# Patient Record
Sex: Female | Born: 1990 | Race: White | Hispanic: No | Marital: Single | Smoking: Never smoker
Health system: Southern US, Community
[De-identification: ages and names within clinical notes are randomized; demographics above are authoritative.]

## PROBLEM LIST (undated history)

## (undated) DIAGNOSIS — J352 Hypertrophy of adenoids: Secondary | ICD-10-CM

## (undated) DIAGNOSIS — F419 Anxiety disorder, unspecified: Secondary | ICD-10-CM

## (undated) DIAGNOSIS — M2652 Limited mandibular range of motion: Secondary | ICD-10-CM

## (undated) DIAGNOSIS — G43909 Migraine, unspecified, not intractable, without status migrainosus: Secondary | ICD-10-CM

## (undated) DIAGNOSIS — R05 Cough: Secondary | ICD-10-CM

## (undated) HISTORY — PX: WISDOM TOOTH EXTRACTION: SHX21

---
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
## 2013-12-30 DIAGNOSIS — J352 Hypertrophy of adenoids: Secondary | ICD-10-CM

## 2013-12-30 HISTORY — DX: Hypertrophy of adenoids: J35.2

## 2014-01-08 ENCOUNTER — Encounter (HOSPITAL_BASED_OUTPATIENT_CLINIC_OR_DEPARTMENT_OTHER): Payer: Self-pay | Admitting: *Deleted

## 2014-01-08 DIAGNOSIS — R059 Cough, unspecified: Secondary | ICD-10-CM

## 2014-01-08 HISTORY — DX: Cough, unspecified: R05.9

## 2014-01-13 ENCOUNTER — Ambulatory Visit (HOSPITAL_BASED_OUTPATIENT_CLINIC_OR_DEPARTMENT_OTHER): Admission: RE | Admit: 2014-01-13 | Payer: Self-pay | Source: Ambulatory Visit | Admitting: Otolaryngology

## 2014-01-13 HISTORY — DX: Anxiety disorder, unspecified: F41.9

## 2014-01-13 HISTORY — DX: Migraine, unspecified, not intractable, without status migrainosus: G43.909

## 2014-01-13 HISTORY — DX: Limited mandibular range of motion: M26.52

## 2014-01-13 HISTORY — DX: Cough: R05

## 2014-01-13 HISTORY — DX: Hypertrophy of adenoids: J35.2

## 2014-01-13 SURGERY — TONSILLECTOMY AND ADENOIDECTOMY
Anesthesia: General | Laterality: Bilateral

## 2015-07-15 DIAGNOSIS — Z6820 Body mass index (BMI) 20.0-20.9, adult: Secondary | ICD-10-CM | POA: Diagnosis not present

## 2015-07-15 DIAGNOSIS — Z113 Encounter for screening for infections with a predominantly sexual mode of transmission: Secondary | ICD-10-CM | POA: Diagnosis not present

## 2015-07-15 DIAGNOSIS — Z01419 Encounter for gynecological examination (general) (routine) without abnormal findings: Secondary | ICD-10-CM | POA: Diagnosis not present

## 2015-07-21 DIAGNOSIS — L739 Follicular disorder, unspecified: Secondary | ICD-10-CM | POA: Diagnosis not present

## 2015-09-07 DIAGNOSIS — Z23 Encounter for immunization: Secondary | ICD-10-CM | POA: Diagnosis not present

## 2015-09-07 DIAGNOSIS — Z111 Encounter for screening for respiratory tuberculosis: Secondary | ICD-10-CM | POA: Diagnosis not present

## 2015-09-09 DIAGNOSIS — Z23 Encounter for immunization: Secondary | ICD-10-CM | POA: Diagnosis not present

## 2015-09-21 DIAGNOSIS — M545 Low back pain: Secondary | ICD-10-CM | POA: Diagnosis not present

## 2015-09-21 DIAGNOSIS — M41124 Adolescent idiopathic scoliosis, thoracic region: Secondary | ICD-10-CM | POA: Diagnosis not present

## 2015-09-21 DIAGNOSIS — M546 Pain in thoracic spine: Secondary | ICD-10-CM | POA: Diagnosis not present

## 2015-09-21 DIAGNOSIS — F411 Generalized anxiety disorder: Secondary | ICD-10-CM | POA: Diagnosis not present

## 2015-09-22 DIAGNOSIS — M545 Low back pain: Secondary | ICD-10-CM | POA: Diagnosis not present

## 2015-11-05 DIAGNOSIS — M545 Low back pain: Secondary | ICD-10-CM | POA: Diagnosis not present

## 2015-11-05 DIAGNOSIS — G43109 Migraine with aura, not intractable, without status migrainosus: Secondary | ICD-10-CM | POA: Diagnosis not present

## 2015-11-05 DIAGNOSIS — F411 Generalized anxiety disorder: Secondary | ICD-10-CM | POA: Diagnosis not present

## 2016-01-20 DIAGNOSIS — F411 Generalized anxiety disorder: Secondary | ICD-10-CM | POA: Diagnosis not present

## 2016-04-04 DIAGNOSIS — F411 Generalized anxiety disorder: Secondary | ICD-10-CM | POA: Diagnosis not present

## 2016-07-11 DIAGNOSIS — N939 Abnormal uterine and vaginal bleeding, unspecified: Secondary | ICD-10-CM | POA: Diagnosis not present

## 2016-07-11 DIAGNOSIS — Z3202 Encounter for pregnancy test, result negative: Secondary | ICD-10-CM | POA: Diagnosis not present

## 2016-07-20 DIAGNOSIS — Z01419 Encounter for gynecological examination (general) (routine) without abnormal findings: Secondary | ICD-10-CM | POA: Diagnosis not present

## 2016-07-20 DIAGNOSIS — Z6821 Body mass index (BMI) 21.0-21.9, adult: Secondary | ICD-10-CM | POA: Diagnosis not present

## 2016-11-09 DIAGNOSIS — N921 Excessive and frequent menstruation with irregular cycle: Secondary | ICD-10-CM | POA: Diagnosis not present

## 2016-12-18 DIAGNOSIS — Z3009 Encounter for other general counseling and advice on contraception: Secondary | ICD-10-CM | POA: Diagnosis not present

## 2016-12-19 DIAGNOSIS — F411 Generalized anxiety disorder: Secondary | ICD-10-CM | POA: Diagnosis not present

## 2017-01-19 DIAGNOSIS — F419 Anxiety disorder, unspecified: Secondary | ICD-10-CM | POA: Diagnosis not present

## 2017-01-19 DIAGNOSIS — F324 Major depressive disorder, single episode, in partial remission: Secondary | ICD-10-CM | POA: Diagnosis not present

## 2017-04-05 DIAGNOSIS — L700 Acne vulgaris: Secondary | ICD-10-CM | POA: Diagnosis not present

## 2017-07-24 DIAGNOSIS — Z13220 Encounter for screening for lipoid disorders: Secondary | ICD-10-CM | POA: Diagnosis not present

## 2017-07-24 DIAGNOSIS — Z0000 Encounter for general adult medical examination without abnormal findings: Secondary | ICD-10-CM | POA: Diagnosis not present

## 2017-07-24 DIAGNOSIS — F411 Generalized anxiety disorder: Secondary | ICD-10-CM | POA: Diagnosis not present

## 2017-07-24 DIAGNOSIS — Z209 Contact with and (suspected) exposure to unspecified communicable disease: Secondary | ICD-10-CM | POA: Diagnosis not present

## 2017-07-24 DIAGNOSIS — Z111 Encounter for screening for respiratory tuberculosis: Secondary | ICD-10-CM | POA: Diagnosis not present

## 2017-08-10 DIAGNOSIS — Z111 Encounter for screening for respiratory tuberculosis: Secondary | ICD-10-CM | POA: Diagnosis not present

## 2017-12-24 DIAGNOSIS — Z01419 Encounter for gynecological examination (general) (routine) without abnormal findings: Secondary | ICD-10-CM | POA: Diagnosis not present

## 2017-12-24 DIAGNOSIS — Z6823 Body mass index (BMI) 23.0-23.9, adult: Secondary | ICD-10-CM | POA: Diagnosis not present

## 2017-12-24 DIAGNOSIS — Z1231 Encounter for screening mammogram for malignant neoplasm of breast: Secondary | ICD-10-CM | POA: Diagnosis not present

## 2017-12-26 DIAGNOSIS — F419 Anxiety disorder, unspecified: Secondary | ICD-10-CM | POA: Diagnosis not present

## 2018-01-31 DIAGNOSIS — F324 Major depressive disorder, single episode, in partial remission: Secondary | ICD-10-CM | POA: Diagnosis not present

## 2018-01-31 DIAGNOSIS — F419 Anxiety disorder, unspecified: Secondary | ICD-10-CM | POA: Diagnosis not present

## 2018-02-10 DIAGNOSIS — J069 Acute upper respiratory infection, unspecified: Secondary | ICD-10-CM | POA: Diagnosis not present

## 2018-02-15 DIAGNOSIS — J22 Unspecified acute lower respiratory infection: Secondary | ICD-10-CM | POA: Diagnosis not present

## 2018-04-09 DIAGNOSIS — L218 Other seborrheic dermatitis: Secondary | ICD-10-CM | POA: Diagnosis not present

## 2018-04-09 DIAGNOSIS — L821 Other seborrheic keratosis: Secondary | ICD-10-CM | POA: Diagnosis not present

## 2018-05-21 DIAGNOSIS — L821 Other seborrheic keratosis: Secondary | ICD-10-CM | POA: Diagnosis not present

## 2018-06-11 DIAGNOSIS — F324 Major depressive disorder, single episode, in partial remission: Secondary | ICD-10-CM | POA: Diagnosis not present

## 2018-06-11 DIAGNOSIS — F411 Generalized anxiety disorder: Secondary | ICD-10-CM | POA: Diagnosis not present

## 2018-07-09 DIAGNOSIS — F324 Major depressive disorder, single episode, in partial remission: Secondary | ICD-10-CM | POA: Diagnosis not present

## 2018-07-09 DIAGNOSIS — F411 Generalized anxiety disorder: Secondary | ICD-10-CM | POA: Diagnosis not present

## 2018-07-16 DIAGNOSIS — F4323 Adjustment disorder with mixed anxiety and depressed mood: Secondary | ICD-10-CM | POA: Diagnosis not present

## 2018-07-29 DIAGNOSIS — F4323 Adjustment disorder with mixed anxiety and depressed mood: Secondary | ICD-10-CM | POA: Diagnosis not present

## 2018-09-05 DIAGNOSIS — M546 Pain in thoracic spine: Secondary | ICD-10-CM | POA: Diagnosis not present

## 2018-09-05 DIAGNOSIS — M9903 Segmental and somatic dysfunction of lumbar region: Secondary | ICD-10-CM | POA: Diagnosis not present

## 2018-09-05 DIAGNOSIS — M9902 Segmental and somatic dysfunction of thoracic region: Secondary | ICD-10-CM | POA: Diagnosis not present

## 2018-09-05 DIAGNOSIS — M25551 Pain in right hip: Secondary | ICD-10-CM | POA: Diagnosis not present

## 2018-09-12 DIAGNOSIS — M9902 Segmental and somatic dysfunction of thoracic region: Secondary | ICD-10-CM | POA: Diagnosis not present

## 2018-09-12 DIAGNOSIS — M9903 Segmental and somatic dysfunction of lumbar region: Secondary | ICD-10-CM | POA: Diagnosis not present

## 2018-09-12 DIAGNOSIS — M25551 Pain in right hip: Secondary | ICD-10-CM | POA: Diagnosis not present

## 2018-09-12 DIAGNOSIS — M546 Pain in thoracic spine: Secondary | ICD-10-CM | POA: Diagnosis not present

## 2018-09-17 DIAGNOSIS — M546 Pain in thoracic spine: Secondary | ICD-10-CM | POA: Diagnosis not present

## 2018-09-17 DIAGNOSIS — M25551 Pain in right hip: Secondary | ICD-10-CM | POA: Diagnosis not present

## 2018-09-17 DIAGNOSIS — M9902 Segmental and somatic dysfunction of thoracic region: Secondary | ICD-10-CM | POA: Diagnosis not present

## 2018-09-17 DIAGNOSIS — M9903 Segmental and somatic dysfunction of lumbar region: Secondary | ICD-10-CM | POA: Diagnosis not present

## 2018-09-25 DIAGNOSIS — M9903 Segmental and somatic dysfunction of lumbar region: Secondary | ICD-10-CM | POA: Diagnosis not present

## 2018-09-25 DIAGNOSIS — M546 Pain in thoracic spine: Secondary | ICD-10-CM | POA: Diagnosis not present

## 2018-09-25 DIAGNOSIS — M9902 Segmental and somatic dysfunction of thoracic region: Secondary | ICD-10-CM | POA: Diagnosis not present

## 2018-09-25 DIAGNOSIS — M25551 Pain in right hip: Secondary | ICD-10-CM | POA: Diagnosis not present

## 2018-09-30 DIAGNOSIS — M546 Pain in thoracic spine: Secondary | ICD-10-CM | POA: Diagnosis not present

## 2018-09-30 DIAGNOSIS — M9903 Segmental and somatic dysfunction of lumbar region: Secondary | ICD-10-CM | POA: Diagnosis not present

## 2018-09-30 DIAGNOSIS — M9902 Segmental and somatic dysfunction of thoracic region: Secondary | ICD-10-CM | POA: Diagnosis not present

## 2018-09-30 DIAGNOSIS — M25551 Pain in right hip: Secondary | ICD-10-CM | POA: Diagnosis not present

## 2018-10-08 DIAGNOSIS — M25551 Pain in right hip: Secondary | ICD-10-CM | POA: Diagnosis not present

## 2018-10-08 DIAGNOSIS — M9902 Segmental and somatic dysfunction of thoracic region: Secondary | ICD-10-CM | POA: Diagnosis not present

## 2018-10-08 DIAGNOSIS — M9903 Segmental and somatic dysfunction of lumbar region: Secondary | ICD-10-CM | POA: Diagnosis not present

## 2018-10-08 DIAGNOSIS — M546 Pain in thoracic spine: Secondary | ICD-10-CM | POA: Diagnosis not present

## 2018-10-15 DIAGNOSIS — M9902 Segmental and somatic dysfunction of thoracic region: Secondary | ICD-10-CM | POA: Diagnosis not present

## 2018-10-15 DIAGNOSIS — M546 Pain in thoracic spine: Secondary | ICD-10-CM | POA: Diagnosis not present

## 2018-10-15 DIAGNOSIS — M25551 Pain in right hip: Secondary | ICD-10-CM | POA: Diagnosis not present

## 2018-10-15 DIAGNOSIS — M9903 Segmental and somatic dysfunction of lumbar region: Secondary | ICD-10-CM | POA: Diagnosis not present

## 2018-10-23 DIAGNOSIS — M546 Pain in thoracic spine: Secondary | ICD-10-CM | POA: Diagnosis not present

## 2018-10-23 DIAGNOSIS — M9902 Segmental and somatic dysfunction of thoracic region: Secondary | ICD-10-CM | POA: Diagnosis not present

## 2018-10-23 DIAGNOSIS — M25551 Pain in right hip: Secondary | ICD-10-CM | POA: Diagnosis not present

## 2018-10-23 DIAGNOSIS — M9903 Segmental and somatic dysfunction of lumbar region: Secondary | ICD-10-CM | POA: Diagnosis not present

## 2018-11-13 DIAGNOSIS — M546 Pain in thoracic spine: Secondary | ICD-10-CM | POA: Diagnosis not present

## 2018-11-13 DIAGNOSIS — M25551 Pain in right hip: Secondary | ICD-10-CM | POA: Diagnosis not present

## 2018-11-13 DIAGNOSIS — M9903 Segmental and somatic dysfunction of lumbar region: Secondary | ICD-10-CM | POA: Diagnosis not present

## 2018-11-13 DIAGNOSIS — M9902 Segmental and somatic dysfunction of thoracic region: Secondary | ICD-10-CM | POA: Diagnosis not present

## 2019-01-03 DIAGNOSIS — Z01419 Encounter for gynecological examination (general) (routine) without abnormal findings: Secondary | ICD-10-CM | POA: Diagnosis not present

## 2019-01-03 DIAGNOSIS — F419 Anxiety disorder, unspecified: Secondary | ICD-10-CM | POA: Diagnosis not present

## 2019-01-03 DIAGNOSIS — F324 Major depressive disorder, single episode, in partial remission: Secondary | ICD-10-CM | POA: Diagnosis not present

## 2019-01-03 DIAGNOSIS — Z113 Encounter for screening for infections with a predominantly sexual mode of transmission: Secondary | ICD-10-CM | POA: Diagnosis not present

## 2019-01-03 DIAGNOSIS — N760 Acute vaginitis: Secondary | ICD-10-CM | POA: Diagnosis not present

## 2019-01-03 DIAGNOSIS — Z6823 Body mass index (BMI) 23.0-23.9, adult: Secondary | ICD-10-CM | POA: Diagnosis not present

## 2020-06-25 ENCOUNTER — Encounter: Payer: Self-pay | Admitting: Neurology

## 2020-10-07 NOTE — Progress Notes (Signed)
NEUROLOGY CONSULTATION NOTE  Carolyn Hodges MRN: [id number] DOB: 02-Feb-1990  Referring provider: Laurann Montana, MD Primary care provider: Laurann Montana, MD  Reason for consult:  headaches  Assessment/Plan:   Migraine with aura, without status migrainosus, not intractable  Migraine prevention:  continue zonisamide 100mg  daily Migraine rescue:  She will try Maxalt-MLT 10mg  with Zofran-ODT 4mg  Limit use of pain relievers to no more than 2 days out of week to prevent risk of rebound or medication-overuse headache. Keep headache diary Follow up 6 months    Subjective:  Carolyn Hodges is a 29 year old female who presents for headaches.  History supplemented by referring provider's note.  Onset:  30 years old Location:  diffuse Quality:  throbbing Intensity:  3-7/10 Aura:  preceded by bilateral hand numbness, tunnel vision, followed by perioral numbness Prodrome:  absent Associated symptoms:  Nausea, vomiting, photophobia, phonophobia.  She denies associated vomiting, autonomic symptoms or unilateral weakness. Duration:  45 minutes to several hours to days (usually 5-6 hours) Frequency:  1-3 times a month.  Since starting zonisamide in November 2021, she has had about one every other month Frequency of abortive medication: 1 every other month at most Triggers:  Sometimes may be emotional stress Relieving factors:  stay in bed, cold compress on head Activity:  movement/activity aggravates headache  Current NSAIDS/analgesics:  none Current triptans:  none Current ergotamine:  none Current anti-emetic:  none Current muscle relaxants:  none Current Antihypertensive medications:  none Current Antidepressant medications:  Trintelix Current Anticonvulsant medications:  zonisamide 100mg  daily Current anti-CGRP:  none Current Vitamins/Herbal/Supplements:  none Current Antihistamines/Decongestants:  none Other therapy:  none Hormone/birth control:  none   Past  NSAIDS/analgesics:  ibuprofen, acetaminophen Past abortive triptans:  Zomig 5mg  NS  Past abortive ergotamine:  none Past muscle relaxants:  baclofen (ineffective) Past anti-emetic:  none Past antihypertensive medications:  none Past antidepressant medications:  sertraline, Lexapro Past anticonvulsant medications:  none Past anti-CGRP:  none Past vitamins/Herbal/Supplements:  none Past antihistamines/decongestants:  none Other past therapies:  none  Caffeine:  3 oz coffee 4 days a week. Diet:  Usually drinks a lot of water.  Does not skip meals Exercise:  yes Depression:  yes; Anxiety:  yes.  Stable Other pain:  back pain Sleep hygiene:  varies. Family history of headache:  mom    Remote CT head from 09/27/2005 personally reviewed was negative.  PAST MEDICAL HISTORY: Past Medical History:  Diagnosis Date   Adenoid hypertrophy 12/2013   Anxiety    Cough 01/08/2014   Limited jaw range of motion    states jaw locks occasionally   Migraines     PAST SURGICAL HISTORY: Past Surgical History:  Procedure Laterality Date   WISDOM TOOTH EXTRACTION      MEDICATIONS: Current Outpatient Medications on File Prior to Visit  Medication Sig Dispense Refill   escitalopram (LEXAPRO) 10 MG tablet Take 10 mg by mouth daily.     Multiple Vitamin (MULTIVITAMIN) tablet Take 1 tablet by mouth daily.     norgestimate-ethinyl estradiol (MONO-LINYAH) 0.25-35 MG-MCG tablet Take 1 tablet by mouth daily.     No current facility-administered medications on file prior to visit.    ALLERGIES: Allergies  Allergen Reactions   Sulfa Antibiotics Other (See Comments)    UNKNOWN - AS A CHILD    FAMILY HISTORY: No family history on file.  Objective:  Blood pressure 116/84, pulse 84, height 5\' 6"  (1.676 m), weight 143 lb (64.9 kg), SpO2 98 %.  General: No acute distress.  Patient appears well-groomed.   Head:  Normocephalic/atraumatic Eyes:  fundi examined but not visualized Neck: supple, no  paraspinal tenderness, full range of motion Back: No paraspinal tenderness Heart: regular rate and rhythm Lungs: Clear to auscultation bilaterally. Vascular: No carotid bruits. Neurological Exam: Mental status: alert and oriented to person, place, and time, recent and remote memory intact, fund of knowledge intact, attention and concentration intact, speech fluent and not dysarthric, language intact. Cranial nerves: CN I: not tested CN II: pupils equal, round and reactive to light, visual fields intact CN III, IV, VI:  full range of motion, no nystagmus, no ptosis CN V: facial sensation intact. CN VII: upper and lower face symmetric CN VIII: hearing intact CN IX, X: gag intact, uvula midline CN XI: sternocleidomastoid and trapezius muscles intact CN XII: tongue midline Bulk & Tone: normal, no fasciculations. Motor:  muscle strength 5/5 throughout Sensation:  Pinprick, temperature and vibratory sensation intact. Deep Tendon Reflexes:  2+ throughout,  toes downgoing.   Finger to nose testing:  Without dysmetria.   Heel to shin:  Without dysmetria.   Gait:  Normal station and stride.  Romberg negative.    Thank you for allowing me to take part in the care of this patient.  Shon Millet, DO  CC: Laurann Montana, MD

## 2020-10-08 ENCOUNTER — Encounter: Payer: Self-pay | Admitting: Neurology

## 2020-10-08 ENCOUNTER — Ambulatory Visit: Payer: Managed Care, Other (non HMO) | Admitting: Neurology

## 2020-10-08 ENCOUNTER — Other Ambulatory Visit: Payer: Self-pay

## 2020-10-08 VITALS — BP 116/84 | HR 84 | Ht 66.0 in | Wt 143.0 lb

## 2020-10-08 DIAGNOSIS — G43109 Migraine with aura, not intractable, without status migrainosus: Secondary | ICD-10-CM

## 2020-10-08 MED ORDER — ZONISAMIDE 100 MG PO CAPS: 100.0000 mg | ORAL_CAPSULE | Freq: Every day | ORAL | 5 refills | Status: DC

## 2020-10-08 MED ORDER — ONDANSETRON 4 MG PO TBDP: 4.0000 mg | ORAL_TABLET | Freq: Three times a day (TID) | ORAL | 5 refills | Status: DC | PRN

## 2020-10-08 MED ORDER — RIZATRIPTAN BENZOATE 10 MG PO TBDP: ORAL_TABLET | ORAL | 5 refills | Status: DC

## 2020-10-08 NOTE — Patient Instructions (Signed)
  Continue zonisamide 100mg  daily Take rizatriptan 30m at earliest onset of headache.  May repeat dose once in 2 hours if needed.  Maximum 2 tablets in 24 hours.  Also take ondansetron for nausea. Limit use of pain relievers to no more than 2 days out of the week.  These medications include acetaminophen, NSAIDs (ibuprofen/Advil/Motrin, naproxen/Aleve, triptans (Imitrex/sumatriptan), Excedrin, and narcotics.  This will help reduce risk of rebound headaches. Be aware of common food triggers:  - Caffeine:  coffee, black tea, cola, Mt. Dew  - Chocolate  - Dairy:  aged cheeses (brie, blue, cheddar, gouda, [place], provolone, [place], Swiss, etc), chocolate milk, buttermilk, sour cream, limit eggs and yogurt  - Nuts, peanut butter  - Alcohol  - Cereals/grains:  FRESH breads (fresh bagels, sourdough, doughnuts), yeast productions  - Processed/canned/aged/cured meats (pre-packaged deli meats, hotdogs)  - MSG/glutamate:  soy sauce, flavor enhancer, pickled/preserved/marinated foods  - Sweeteners:  aspartame (Equal, Nutrasweet).  Sugar and Splenda are okay  - Vegetables:  legumes (lima beans, lentils, snow peas, fava beans, pinto peans, peas, garbanzo beans), sauerkraut, onions, olives, pickles  - Fruit:  avocados, bananas, citrus fruit (orange, lemon, grapefruit), mango  - Other:  Frozen meals, macaroni and cheese Routine exercise Stay adequately hydrated (aim for 64 oz water daily) Keep headache diary Maintain proper stress management Maintain proper sleep hygiene Do not skip meals Consider supplements:  magnesium citrate 400mg  daily, riboflavin 400mg  daily, coenzyme Q10 100mg  three times daily.

## 2021-01-03 ENCOUNTER — Encounter: Payer: Self-pay | Admitting: Neurology

## 2021-04-03 ENCOUNTER — Other Ambulatory Visit: Payer: Self-pay | Admitting: Neurology

## 2021-04-06 NOTE — Progress Notes (Signed)
? ?Virtual Visit via Video Note ?The purpose of this virtual visit is to provide medical care while limiting exposure to the novel coronavirus.   ? ?Consent was obtained for video visit:  Yes.   ?Answered questions that patient had about telehealth interaction:  Yes.   ?I discussed the limitations, risks, security and privacy concerns of performing an evaluation and management service by telemedicine. I also discussed with the patient that there may be a patient responsible charge related to this service. The patient expressed understanding and agreed to proceed. ? ?Pt location: Home ?Physician Location: office ?Name of referring provider:  Harlan Stains, MD ?I connected with Carolyn Hodges at patients initiation/request on 04/08/2021 at  9:10 AM EST by video enabled telemedicine application and verified that I am speaking with the correct person using two identifiers. ?Pt MRN:  [id number] ?Pt DOB:  05/19/1990 ?Video Participants:  Carolyn Hodges ? ?Assessment and Plan:   ?Migraine with aura, without status migrainosus, not intractable ? ?Migraine prevention:  Zonisamide 100mg  daily.  Reports nausea, possibly from the medication but does not want to stop it as it has been effective.  Continue to monitor.  She may consider taking a Zofran. ?Migraine rescue:  rizatriptan MLT 10mg , Zofran for nausea. ?Limit use of pain relievers to no more than 2 days out of week to prevent risk of rebound or medication-overuse headache. ?Keep headache diary ?Follow up 6 months. ? ? ?History of Present Illness:  ?Carolyn Hodges is a 31 year old female who follows up for migraine ? ?UPDATE: ?Intensity:  severe ?Duration:  2 hours ?Frequency:  only one in last 6 months. ? ?She notes that when she first started zonisamide, she would wake up with nausea every morning.  This lasted 2 months and it resolved.  The morning nausea returned about a week and a half ago.  She is not sure if it is related to zonisamide.  ? ?Current  NSAIDS/analgesics:  none ?Current triptans:  rizatriptan MLT 10mg  ?Current ergotamine:  none ?Current anti-emetic:  Zofran 4mg  ?Current muscle relaxants:  none ?Current Antihypertensive medications:  none ?Current Antidepressant medications:  Trintelix ?Current Anticonvulsant medications:  zonisamide 100mg  daily ?Current anti-CGRP:  none ?Current Vitamins/Herbal/Supplements:  none ?Current Antihistamines/Decongestants:  none ?Other therapy:  none ?Hormone/birth control:  none ? ?Caffeine:  3 oz coffee 4 days a week. ?Diet:  Usually drinks a lot of water.  Does not skip meals ?Exercise:  yes ?Depression:  yes; Anxiety:  yes.  Stable ?Other pain:  back pain ?Sleep hygiene:  varies. ? ?HISTORY: ?Onset:  31 years old ?Location:  diffuse ?Quality:  throbbing ?Initial Intensity:  3-7/10 ?Aura:  preceded by bilateral hand numbness, tunnel vision, followed by perioral numbness ?Prodrome:  absent ?Associated symptoms:  Nausea, vomiting, photophobia, phonophobia.  She denies associated vomiting, autonomic symptoms or unilateral weakness. ?Initial Duration:  45 minutes to several hours to days (usually 5-6 hours) ?Initial Frequency:  1-3 times a month.  Since starting zonisamide in November 2021, she has had about one every other month ?Frequency of abortive medication: 1 every other month at most ?Triggers:  Sometimes may be emotional stress, high sugar intake ?Relieving factors:  stay in bed, cold compress on head ?Activity:  movement/activity aggravates headache ? ?  ?Past NSAIDS/analgesics:  ibuprofen, acetaminophen ?Past abortive triptans:  Zomig 5mg  NS  ?Past abortive ergotamine:  none ?Past muscle relaxants:  baclofen (ineffective) ?Past anti-emetic:  none ?Past antihypertensive medications:  none ?Past antidepressant medications:  sertraline, Lexapro ?  Past anticonvulsant medications:  none ?Past anti-CGRP:  none ?Past vitamins/Herbal/Supplements:  none ?Past antihistamines/decongestants:  none ?Other past therapies:   none ?  ? ?Family history of headache:  mom  ?  ? Remote CT head from 09/27/2005 personally reviewed was negative. ? ?Past Medical History: ?Past Medical History:  ?Diagnosis Date  ? Adenoid hypertrophy 12/2013  ? Anxiety   ? Cough 01/08/2014  ? Limited jaw range of motion   ? states jaw locks occasionally  ? Migraines   ? ? ?Medications: ?Outpatient Encounter Medications as of 04/08/2021  ?Medication Sig  ? escitalopram (LEXAPRO) 10 MG tablet Take 10 mg by mouth daily. (Patient not taking: Reported on 10/08/2020)  ? Multiple Vitamin (MULTIVITAMIN) tablet Take 1 tablet by mouth daily.  ? norgestimate-ethinyl estradiol (ORTHO-CYCLEN) 0.25-35 MG-MCG tablet Take 1 tablet by mouth daily. (Patient not taking: Reported on 10/08/2020)  ? ondansetron (ZOFRAN ODT) 4 MG disintegrating tablet Take 1 tablet (4 mg total) by mouth every 8 (eight) hours as needed for nausea or vomiting.  ? rizatriptan (MAXALT-MLT) 10 MG disintegrating tablet Take 1 tablet earliest onset of headache.  May repeat in 2 hours if needed.  Maximum 2 tablets in 24 hours.  ? TRINTELLIX 5 MG TABS tablet Take 5 mg by mouth daily.  ? zonisamide (ZONEGRAN) 100 MG capsule Take 1 capsule (100 mg total) by mouth daily.  ? ?No facility-administered encounter medications on file as of 04/08/2021.  ? ? ?Allergies: ?Allergies  ?Allergen Reactions  ? Sulfa Antibiotics Other (See Comments)  ?  UNKNOWN - AS A CHILD  ? ? ?Family History: ?Family History  ?Problem Relation Age of Onset  ? Migraines Mother   ? ? ?Observations/Objective:   ?No acute distress.  Alert and oriented.  Speech fluent and not dysarthric.  Language intact.  Eyes orthophoric on primary gaze.  Face symmetric. ? ? ?Follow Up Instructions: ?  ? -I discussed the assessment and treatment plan with the patient. The patient was provided an opportunity to ask questions and all were answered. The patient agreed with the plan and demonstrated an understanding of the instructions. ?  ?The patient was advised to call  back or seek an in-person evaluation if the symptoms worsen or if the condition fails to improve as anticipated. ? ? ?Dudley Major, DO ? ?

## 2021-04-08 ENCOUNTER — Telehealth (INDEPENDENT_AMBULATORY_CARE_PROVIDER_SITE_OTHER): Payer: Managed Care, Other (non HMO) | Admitting: Neurology

## 2021-04-08 ENCOUNTER — Encounter: Payer: Self-pay | Admitting: Neurology

## 2021-04-08 VITALS — Ht 66.0 in | Wt 143.0 lb

## 2021-04-08 DIAGNOSIS — G43109 Migraine with aura, not intractable, without status migrainosus: Secondary | ICD-10-CM

## 2021-04-08 MED ORDER — ZONISAMIDE 100 MG PO CAPS: 100.0000 mg | ORAL_CAPSULE | Freq: Every day | ORAL | 5 refills | Status: DC

## 2021-04-22 ENCOUNTER — Ambulatory Visit: Payer: Managed Care, Other (non HMO) | Admitting: Neurology

## 2021-09-28 NOTE — Progress Notes (Unsigned)
NEUROLOGY FOLLOW UP OFFICE NOTE  TIZIANA CISLO [id number]  Assessment/Plan:   Migraine with aura, without status migrainosus, not intractable   Migraine prevention:  Changing jobs will likely help but she would still like to increase zonisamide now.  Increase zonisamide to 200mg  daily.   Migraine rescue:  rizatriptan MLT 10mg , Zofran for nausea. Limit use of pain relievers to no more than 2 days out of week to prevent risk of rebound or medication-overuse headache. Keep headache diary Follow up 6 months.  Subjective:  JANITZA REVUELTA is a 31 year old female who follows up for migraine   UPDATE: Started having increased migraines soon after last visit.  She reports it was job-related stress.  However, she is starting a new job next month and hopes that will help Intensity:  severe Duration:  20-60 minutes with rizatriptan Frequency:  4-5 a month.    Current NSAIDS/analgesics:  none Current triptans:  rizatriptan MLT 10mg  Current ergotamine:  none Current anti-emetic:  Zofran 4mg  Current muscle relaxants:  none Current Antihypertensive medications:  none Current Antidepressant medications:  Trintelix Current Anticonvulsant medications:  zonisamide 100mg  daily Current anti-CGRP:  none Current Vitamins/Herbal/Supplements:  none Current Antihistamines/Decongestants:  none Other therapy:  none Hormone/birth control:  none   Caffeine:  3 oz coffee 4 days a week. Diet:  Usually drinks a lot of water.  Does not skip meals Exercise:  yes Depression:  yes; Anxiety:  yes.  Stable Other pain:  back pain Sleep hygiene:  varies.   HISTORY: Onset:  31 years old Location:  diffuse Quality:  throbbing Initial Intensity:  3-7/10 Aura:  preceded by bilateral hand numbness, tunnel vision, followed by perioral numbness Prodrome:  absent Associated symptoms:  Nausea, vomiting, photophobia, phonophobia.  She denies associated vomiting, autonomic symptoms or unilateral  weakness. Initial Duration:  45 minutes to several hours to days (usually 5-6 hours) Initial Frequency:  1-3 times a month.  Since starting zonisamide in November 2021, she has had about one every other month Frequency of abortive medication: 1 every other month at most Triggers:  Sometimes may be emotional stress, high sugar intake Relieving factors:  stay in bed, cold compress on head Activity:  movement/activity aggravates headache     Past NSAIDS/analgesics:  ibuprofen, acetaminophen Past abortive triptans:  Zomig 5mg  NS  Past abortive ergotamine:  none Past muscle relaxants:  baclofen (ineffective) Past anti-emetic:  none Past antihypertensive medications:  none Past antidepressant medications:  sertraline, Lexapro Past anticonvulsant medications:  none Past anti-CGRP:  none Past vitamins/Herbal/Supplements:  none Past antihistamines/decongestants:  none Other past therapies:  none     Family history of headache:  mom     Remote CT head from 09/27/2005 personally reviewed was negative.  PAST MEDICAL HISTORY: Past Medical History:  Diagnosis Date   Adenoid hypertrophy 12/2013   Anxiety    Cough 01/08/2014   Limited jaw range of motion    states jaw locks occasionally   Migraines     MEDICATIONS: Current Outpatient Medications on File Prior to Visit  Medication Sig Dispense Refill   etonogestrel-ethinyl estradiol (NUVARING) 0.12-0.015 MG/24HR vaginal ring 1 ring leave in place for 3 weeks, remove, and replace with a new ring after 7 day break     ondansetron (ZOFRAN ODT) 4 MG disintegrating tablet Take 1 tablet (4 mg total) by mouth every 8 (eight) hours as needed for nausea or vomiting. 20 tablet 5   rizatriptan (MAXALT-MLT) 10 MG disintegrating tablet Take 1 tablet earliest  onset of headache.  May repeat in 2 hours if needed.  Maximum 2 tablets in 24 hours. 10 tablet 5   TRINTELLIX 5 MG TABS tablet Take 5 mg by mouth daily.     zonisamide (ZONEGRAN) 100 MG capsule  Take 1 capsule (100 mg total) by mouth daily. 30 capsule 5   No current facility-administered medications on file prior to visit.    ALLERGIES: Allergies  Allergen Reactions   Sulfa Antibiotics Other (See Comments)    UNKNOWN - AS A CHILD    FAMILY HISTORY: Family History  Problem Relation Age of Onset   Migraines Mother       Objective:  Blood pressure 116/84, pulse 74, resp. rate 18, height 5\' 6"  (1.676 m), weight 143 lb (64.9 kg). General: No acute distress.  Patient appears well-groomed.   Head:  Normocephalic/atraumatic Eyes:  Fundi examined but not visualized Neck: supple, no paraspinal tenderness, full range of motion Heart:  Regular rate and rhythm Neurological Exam: alert and oriented to person, place, and time.  Speech fluent and not dysarthric, language intact.  CN II-XII intact. Bulk and tone normal, muscle strength 5/5 throughout.  Sensation to light touch intact.  Deep tendon reflexes 2+ throughout, toes downgoing.  Finger to nose testing intact.  Gait normal, Romberg negative.   , DO  CC: Shon Millet, MD

## 2021-09-30 ENCOUNTER — Encounter: Payer: Self-pay | Admitting: Neurology

## 2021-09-30 ENCOUNTER — Ambulatory Visit: Payer: Managed Care, Other (non HMO) | Admitting: Neurology

## 2021-09-30 VITALS — BP 116/84 | HR 74 | Resp 18 | Ht 66.0 in | Wt 143.0 lb

## 2021-09-30 DIAGNOSIS — G43109 Migraine with aura, not intractable, without status migrainosus: Secondary | ICD-10-CM

## 2021-09-30 MED ORDER — RIZATRIPTAN BENZOATE 10 MG PO TBDP: ORAL_TABLET | ORAL | 5 refills | Status: DC

## 2021-09-30 MED ORDER — ONDANSETRON 4 MG PO TBDP: 4.0000 mg | ORAL_TABLET | Freq: Three times a day (TID) | ORAL | 5 refills | Status: DC | PRN

## 2021-09-30 MED ORDER — ZONISAMIDE 100 MG PO CAPS: 200.0000 mg | ORAL_CAPSULE | Freq: Every day | ORAL | 5 refills | Status: DC

## 2021-09-30 NOTE — Patient Instructions (Signed)
Increase zonisamide to 200mg  daily Refilled rizatriptan and zofran Limit use of pain relievers to no more than 2 days out of week to prevent risk of rebound or medication-overuse headache. Keep headache diary Follow up 4 months.

## 2022-03-01 NOTE — Progress Notes (Signed)
NEUROLOGY FOLLOW UP OFFICE NOTE  Carolyn Hodges [id number]  Assessment/Plan:   Migraine with aura, without status migrainosus, not intractable   Migraine prevention:  zonisamide 200mg  daily  Migraine rescue:  rizatriptan MLT 10mg , Zofran for nausea.  Limit use of pain relievers to no more than 2 days out of week to prevent risk of rebound or medication-overuse headache. Keep headache diary Follow up one year  Subjective:  Carolyn Hodges is a 32 year old female who follows up for migraine   UPDATE: Zonisamide was increased and she has changed jobs.  She loves her new job.   No migraines since last visit.      Current NSAIDS/analgesics:  none Current triptans:  rizatriptan MLT 10mg  Current ergotamine:  none Current anti-emetic:  Zofran 4mg  Current muscle relaxants:  none Current Antihypertensive medications:  none Current Antidepressant medications:  Trintelix Current Anticonvulsant medications:  zonisamide 200mg  daily Current anti-CGRP:  none Current Vitamins/Herbal/Supplements:  none Current Antihistamines/Decongestants:  none Other therapy:  none Hormone/birth control:  none   Caffeine:  3 oz coffee 4 days a week. Diet:  Usually drinks a lot of water.  Does not skip meals Exercise:  yes Depression:  yes; Anxiety:  yes.  Stable Other pain:  back pain Sleep hygiene:  varies.   HISTORY: Onset:  32 years old Location:  diffuse Quality:  throbbing Initial Intensity:  3-7/10 Aura:  preceded by bilateral hand numbness, tunnel vision, followed by perioral numbness Prodrome:  absent Associated symptoms:  Nausea, vomiting, photophobia, phonophobia.  She denies associated vomiting, autonomic symptoms or unilateral weakness. Initial Duration:  45 minutes to several hours to days (usually 5-6 hours) Initial Frequency:  1-3 times a month.  Since starting zonisamide in November 2021, she has had about one every other month Frequency of abortive medication: 1 every other  month at most Triggers:  Sometimes may be emotional stress, high sugar intake Relieving factors:  stay in bed, cold compress on head Activity:  movement/activity aggravates headache     Past NSAIDS/analgesics:  ibuprofen, acetaminophen Past abortive triptans:  Zomig 5mg  NS  Past abortive ergotamine:  none Past muscle relaxants:  baclofen (ineffective) Past anti-emetic:  none Past antihypertensive medications:  none Past antidepressant medications:  sertraline, Lexapro Past anticonvulsant medications:  none Past anti-CGRP:  none Past vitamins/Herbal/Supplements:  none Past antihistamines/decongestants:  none Other past therapies:  none     Family history of headache:  mom     Remote CT head from 09/27/2005 personally reviewed was negative.  PAST MEDICAL HISTORY: Past Medical History:  Diagnosis Date   Adenoid hypertrophy 12/2013   Anxiety    Cough 01/08/2014   Limited jaw range of motion    states jaw locks occasionally   Migraines     MEDICATIONS: Current Outpatient Medications on File Prior to Visit  Medication Sig Dispense Refill   etonogestrel-ethinyl estradiol (NUVARING) 0.12-0.015 MG/24HR vaginal ring 1 ring leave in place for 3 weeks, remove, and replace with a new ring after 7 day break     ondansetron (ZOFRAN ODT) 4 MG disintegrating tablet Take 1 tablet (4 mg total) by mouth every 8 (eight) hours as needed for nausea or vomiting. 20 tablet 5   rizatriptan (MAXALT-MLT) 10 MG disintegrating tablet Take 1 tablet earliest onset of headache.  May repeat in 2 hours if needed.  Maximum 2 tablets in 24 hours. 10 tablet 5   TRINTELLIX 5 MG TABS tablet Take 5 mg by mouth daily.  zonisamide (ZONEGRAN) 100 MG capsule Take 2 capsules (200 mg total) by mouth daily. 60 capsule 5   No current facility-administered medications on file prior to visit.    ALLERGIES: Allergies  Allergen Reactions   Sulfa Antibiotics Other (See Comments)    UNKNOWN - AS A CHILD    FAMILY  HISTORY: Family History  Problem Relation Age of Onset   Migraines Mother       Objective:  Blood pressure 119/77, pulse 83, resp. rate 20, height 5\' 6"  (1.676 m), weight 147 lb (66.7 kg), SpO2 100 %. General: No acute distress.  Patient appears well-groomed.   Head:  normocephalic/atraumatic Neck:  supple, no paraspinal tenderness, full range of motion Heart:  RRR Neuro:  alert and oriented to person, place, and time.  Speech fluent and not dysarthric, language intact.  CN II-XII intact. Bulk and tone normal, muscle strength 5/5 throughout.  Sensation to light touch intact.  Deep tendon reflexes 2+ throughout, toes downgoing.  Finger to nose testing intact.  Gait normal, Romberg negative.   Carolyn Clines, DO  CC: Harlan Stains, MD

## 2022-03-03 ENCOUNTER — Encounter: Payer: Self-pay | Admitting: Neurology

## 2022-03-03 ENCOUNTER — Ambulatory Visit (INDEPENDENT_AMBULATORY_CARE_PROVIDER_SITE_OTHER): Payer: Commercial Managed Care - PPO | Admitting: Neurology

## 2022-03-03 VITALS — BP 119/77 | HR 83 | Resp 20 | Ht 66.0 in | Wt 147.0 lb

## 2022-03-03 DIAGNOSIS — G43109 Migraine with aura, not intractable, without status migrainosus: Secondary | ICD-10-CM

## 2022-03-03 MED ORDER — ZONISAMIDE 100 MG PO CAPS: 200.0000 mg | ORAL_CAPSULE | Freq: Every day | ORAL | 3 refills | Status: DC

## 2022-03-03 MED ORDER — RIZATRIPTAN BENZOATE 10 MG PO TBDP: ORAL_TABLET | ORAL | 5 refills | Status: DC

## 2022-03-03 MED ORDER — ONDANSETRON 4 MG PO TBDP: 4.0000 mg | ORAL_TABLET | Freq: Three times a day (TID) | ORAL | 5 refills | Status: DC | PRN

## 2023-03-02 NOTE — Progress Notes (Unsigned)
NEUROLOGY FOLLOW UP OFFICE NOTE  Carolyn Hodges [id number]  Assessment/Plan:   Migraine with aura, without status migrainosus, not intractable   Migraine prevention:  zonisamide 200mg  daily *** Migraine rescue:  rizatriptan MLT 10mg , Zofran for nausea. *** Limit use of pain relievers to no more than 2 days out of week to prevent risk of rebound or medication-overuse headache. Keep headache diary Follow up one year ***  Subjective:  Carolyn Hodges is a 33 year old female who follows up for migraine   UPDATE: ***    Current NSAIDS/analgesics:  none Current triptans:  rizatriptan MLT 10mg  Current ergotamine:  none Current anti-emetic:  Zofran 4mg  Current muscle relaxants:  none Current Antihypertensive medications:  none Current Antidepressant medications:  Trintelix Current Anticonvulsant medications:  zonisamide 200mg  daily Current anti-CGRP:  none Current Vitamins/Herbal/Supplements:  none Current Antihistamines/Decongestants:  none Other therapy:  none Hormone/birth control:  none   Caffeine:  3 oz coffee 4 days a week. Diet:  Usually drinks a lot of water.  Does not skip meals Exercise:  yes Depression:  yes; Anxiety:  yes.  Stable Other pain:  back pain Sleep hygiene:  varies.   HISTORY: Onset:  33 years old Location:  diffuse Quality:  throbbing Initial Intensity:  3-7/10 Aura:  preceded by bilateral hand numbness, tunnel vision, followed by perioral numbness Prodrome:  absent Associated symptoms:  Nausea, vomiting, photophobia, phonophobia.  She denies associated vomiting, autonomic symptoms or unilateral weakness. Initial Duration:  45 minutes to several hours to days (usually 5-6 hours) Initial Frequency:  1-3 times a month.  Since starting zonisamide in November 2021, she has had about one every other month Frequency of abortive medication: 1 every other month at most Triggers:  Sometimes may be emotional stress, high sugar intake Relieving factors:   stay in bed, cold compress on head Activity:  movement/activity aggravates headache     Past NSAIDS/analgesics:  ibuprofen, acetaminophen Past abortive triptans:  Zomig 5mg  NS  Past abortive ergotamine:  none Past muscle relaxants:  baclofen (ineffective) Past anti-emetic:  none Past antihypertensive medications:  none Past antidepressant medications:  sertraline, Lexapro Past anticonvulsant medications:  none Past anti-CGRP:  none Past vitamins/Herbal/Supplements:  none Past antihistamines/decongestants:  none Other past therapies:  none     Family history of headache:  mom     Remote CT head from 09/27/2005 personally reviewed was negative.  PAST MEDICAL HISTORY: Past Medical History:  Diagnosis Date   Adenoid hypertrophy 12/2013   Anxiety    Cough 01/08/2014   Limited jaw range of motion    states jaw locks occasionally   Migraines     MEDICATIONS: Current Outpatient Medications on File Prior to Visit  Medication Sig Dispense Refill   etonogestrel-ethinyl estradiol (NUVARING) 0.12-0.015 MG/24HR vaginal ring 1 ring leave in place for 3 weeks, remove, and replace with a new ring after 7 day break     ondansetron (ZOFRAN ODT) 4 MG disintegrating tablet Take 1 tablet (4 mg total) by mouth every 8 (eight) hours as needed for nausea or vomiting. 20 tablet 5   rizatriptan (MAXALT-MLT) 10 MG disintegrating tablet Take 1 tablet earliest onset of headache.  May repeat in 2 hours if needed.  Maximum 2 tablets in 24 hours. 10 tablet 5   TRINTELLIX 5 MG TABS tablet Take 5 mg by mouth daily.     zonisamide (ZONEGRAN) 100 MG capsule Take 2 capsules (200 mg total) by mouth daily. 180 capsule 3   No current  facility-administered medications on file prior to visit.    ALLERGIES: Allergies  Allergen Reactions   Sulfa Antibiotics Other (See Comments)    UNKNOWN - AS A CHILD    FAMILY HISTORY: Family History  Problem Relation Age of Onset   Migraines Mother       Objective:   *** General: No acute distress.  Patient appears well-groomed.   Head:  Normocephalic/atraumatic Neck:  Supple.  No paraspinal tenderness.  Full range of motion. Heart:  Regular rate and rhythm. Neuro:  Alert and oriented.  Speech fluent and not dysarthric.  Language intact.  CN II-XII intact.  Bulk and tone normal.  Muscle strength 5/5 throughout.  Deep tendon reflexes 2+ throughout.  Gait normal.  Romberg negative.    Carolyn Millet, DO  CC: Carolyn Montana, MD

## 2023-03-05 ENCOUNTER — Encounter: Payer: Self-pay | Admitting: Neurology

## 2023-03-05 ENCOUNTER — Ambulatory Visit: Payer: Commercial Managed Care - PPO | Admitting: Neurology

## 2023-03-05 VITALS — BP 119/82 | HR 82 | Ht 66.0 in | Wt 152.0 lb

## 2023-03-05 DIAGNOSIS — G43109 Migraine with aura, not intractable, without status migrainosus: Secondary | ICD-10-CM | POA: Diagnosis not present

## 2023-03-05 MED ORDER — RIZATRIPTAN BENZOATE 10 MG PO TBDP: ORAL_TABLET | ORAL | 5 refills | Status: AC

## 2023-03-05 MED ORDER — ZONISAMIDE 100 MG PO CAPS: 200.0000 mg | ORAL_CAPSULE | Freq: Every day | ORAL | 3 refills | Status: AC

## 2023-03-05 MED ORDER — ONDANSETRON 4 MG PO TBDP: 4.0000 mg | ORAL_TABLET | Freq: Three times a day (TID) | ORAL | 5 refills | Status: AC | PRN

## 2023-03-05 NOTE — Patient Instructions (Addendum)
Continue zonisamide 200mg  daily At earliest onset of migraine, try taking Ubrelvy.  May repeat after 2 hours (maximum 2 tablets in 24 hours).  Let me know if it works better than rizatriptan Ondansetron for nausea

## 2023-06-21 ENCOUNTER — Telehealth: Payer: Self-pay | Admitting: Neurology

## 2023-06-21 NOTE — Telephone Encounter (Signed)
 Pt called in stating she is pregnant and is on zonisamide . She thinks she is [redacted] weeks pregnant. She thinks she is not supposed to take the zonisamide  when pregnant and would like to know what to do instead?

## 2023-06-22 NOTE — Telephone Encounter (Signed)
 LMOVM for patient to give the office a call back.  Per Dr.Jaffe, She needs to stop zonisamide  and rizatriptan .  If possible, would treat acute headaches with Tylenol.  Majority of women actually have improvement in their migraines during pregnancy, so hopefully she wouldn't need a preventative. She should feel free to schedule a follow up sooner.

## 2023-06-22 NOTE — Telephone Encounter (Signed)
 Patient advised.

## 2024-02-20 ENCOUNTER — Encounter: Payer: Self-pay | Admitting: Neurology

## 2024-02-20 ENCOUNTER — Ambulatory Visit
Admission: RE | Admit: 2024-02-20 | Discharge: 2024-02-20 | Disposition: A | Discharge status: Home / Self Care | Attending: Family Medicine | Admitting: Family Medicine

## 2024-02-20 ENCOUNTER — Ambulatory Visit: Payer: Self-pay | Admitting: Physician Assistant

## 2024-02-20 ENCOUNTER — Other Ambulatory Visit: Payer: Self-pay

## 2024-02-20 ENCOUNTER — Ambulatory Visit (INDEPENDENT_AMBULATORY_CARE_PROVIDER_SITE_OTHER)

## 2024-02-20 VITALS — BP 114/80 | HR 70 | Temp 98.1°F | Resp 20

## 2024-02-20 DIAGNOSIS — M79672 Pain in left foot: Secondary | ICD-10-CM

## 2024-02-20 NOTE — ED Provider Notes (Signed)
 [facility]    CSN: [id number] Arrival date & time: 02/20/24  1148      History   Chief Complaint Chief Complaint  Patient presents with   Foot Pain    Entered by patient    HPI Carolyn Hodges is a 34 y.o. female.    Foot Pain  Left foot pain for 3 days, no known injury, she is 1 month post partum vaginal delivery, she is breastfeeding. Admits mild swelling, denies bruising wounds, redness, increased warmth, weakness, hx of prior left foot issues.No OTC remedies tried.  History reviewed. No pertinent past medical history.  There are no active problems to display for this patient.   History reviewed. No pertinent surgical history.  OB History   No obstetric history on file.      Home Medications    Prior to Admission medications  Not on File    Family History History reviewed. No pertinent family history.  Social History Social History[1]   Allergies   Sulfa antibiotics   Review of Systems Review of Systems   Physical Exam Triage Vital Signs ED Triage Vitals  Encounter Vitals Group     BP 02/20/24 1202 114/80     Girls Systolic BP Percentile --      Girls Diastolic BP Percentile --      Boys Systolic BP Percentile --      Boys Diastolic BP Percentile --      Pulse Rate 02/20/24 1202 70     Resp 02/20/24 1202 20     Temp 02/20/24 1202 98.1 F (36.7 C)     Temp Source 02/20/24 1202 Oral     SpO2 02/20/24 1202 97 %     Weight --      Height --      Head Circumference --      Peak Flow --      Pain Score 02/20/24 1200 4     Pain Loc --      Pain Education --      Exclude from Growth Chart --    No data found.  Updated Vital Signs BP 114/80 (BP Location: Right Arm)   Pulse 70   Temp 98.1 F (36.7 C) (Oral)   Resp 20   SpO2 97%   Breastfeeding Yes   Visual Acuity Right Eye Distance:   Left Eye Distance:   Bilateral Distance:    Right Eye Near:   Left Eye Near:    Bilateral Near:     Physical Exam Vitals and  nursing note reviewed.  Constitutional:      Appearance: Normal appearance.  Musculoskeletal:     Comments: Tender dorsal left foot over distal 3-4th MTP,no swelling, good DP pulse  Skin:    General: Skin is warm and dry.     Findings: No bruising, erythema, lesion or rash.  Neurological:     Mental Status: She is alert.      UC Treatments / Results  Labs (all labs ordered are listed, but only abnormal results are displayed) Labs Reviewed - No data to display  EKG   Radiology No results found.  Procedures Procedures (including critical care time)  Medications Ordered in UC Medications - No data to display  Initial Impression / Assessment and Plan / UC Course  I have reviewed the triage vital signs and the nursing notes.  Pertinent labs & imaging results that were available during my care of the patient were reviewed by me and considered  in my medical decision making (see chart for details).     34 yo female left foot pain, no injury, mild tenderness, no signs of infection Left foot xray reviewed by me, no fx, recommend RICE, Tylenol, supportive footwear, f/u if no improvement in 5 days Final Clinical Impressions(s) / UC Diagnoses   Final diagnoses:  Acute pain of left foot   Discharge Instructions   None    ED Prescriptions   None    PDMP not reviewed this encounter.    [1]  Social History Tobacco Use   Smoking status: Never   Smokeless tobacco: Never  Vaping Use   Vaping status: Never Used  Substance Use Topics   Alcohol use: Yes    Alcohol/week: 2.0 standard drinks of alcohol    Types: 2 Standard drinks or equivalent per week   Drug use: Never     Kameshia Madruga, PA 02/20/24 1220  "

## 2024-02-20 NOTE — ED Triage Notes (Signed)
 PT c/o LT foot pain x 5 days. Pt denies any injury. Pt denies any hx of issues with this foot before. PT has not taken anything for her pain.

## 2024-02-20 NOTE — Discharge Instructions (Signed)
 Over the counter tylenol as directed on the package  The x-ray reading we discussed is preliminary. Your x-ray will be read by a radiologist in next few hours. If there is a discrepancy, you will be contacted, and instructed on a new plan for you care.

## 2024-03-04 ENCOUNTER — Ambulatory Visit: Payer: Commercial Managed Care - PPO | Admitting: Neurology
